# Patient Record
Sex: Male | Born: 1945 | Race: White | Hispanic: No | Marital: Married | Smoking: Current every day smoker
Health system: Southern US, Community
[De-identification: ages and names within clinical notes are randomized; demographics above are authoritative.]

## PROBLEM LIST (undated history)

## (undated) DIAGNOSIS — I10 Essential (primary) hypertension: Secondary | ICD-10-CM

## (undated) HISTORY — PX: GASTRECTOMY: SHX58

## (undated) HISTORY — DX: Essential (primary) hypertension: I10

---

## 2000-04-14 LAB — HM COLONOSCOPY

## 2003-12-20 ENCOUNTER — Ambulatory Visit (HOSPITAL_COMMUNITY): Admission: RE | Admit: 2003-12-20 | Discharge: 2003-12-20 | Payer: Self-pay

## 2004-03-21 ENCOUNTER — Ambulatory Visit: Payer: Self-pay | Admitting: Internal Medicine

## 2004-03-22 ENCOUNTER — Encounter: Admission: RE | Admit: 2004-03-22 | Discharge: 2004-03-22 | Payer: Self-pay | Admitting: Internal Medicine

## 2004-04-16 ENCOUNTER — Ambulatory Visit: Payer: Self-pay | Admitting: Internal Medicine

## 2004-05-07 ENCOUNTER — Ambulatory Visit: Payer: Self-pay | Admitting: Internal Medicine

## 2005-05-21 ENCOUNTER — Ambulatory Visit: Payer: Self-pay | Admitting: Internal Medicine

## 2005-05-28 ENCOUNTER — Ambulatory Visit: Payer: Self-pay | Admitting: Internal Medicine

## 2005-05-28 LAB — CONVERTED CEMR LAB: PSA: 1.33 ng/mL

## 2005-06-25 ENCOUNTER — Ambulatory Visit: Payer: Self-pay | Admitting: Internal Medicine

## 2005-08-28 ENCOUNTER — Ambulatory Visit: Payer: Self-pay | Admitting: Cardiology

## 2005-08-28 ENCOUNTER — Ambulatory Visit: Payer: Self-pay | Admitting: Internal Medicine

## 2005-08-28 ENCOUNTER — Encounter (INDEPENDENT_AMBULATORY_CARE_PROVIDER_SITE_OTHER): Payer: Self-pay | Admitting: Specialist

## 2005-08-28 ENCOUNTER — Observation Stay (HOSPITAL_COMMUNITY): Admission: EM | Admit: 2005-08-28 | Discharge: 2005-08-29 | Payer: Self-pay | Admitting: Emergency Medicine

## 2005-09-03 ENCOUNTER — Ambulatory Visit: Payer: Self-pay | Admitting: Internal Medicine

## 2005-09-26 ENCOUNTER — Ambulatory Visit: Payer: Self-pay | Admitting: Internal Medicine

## 2006-01-06 ENCOUNTER — Ambulatory Visit: Payer: Self-pay | Admitting: Internal Medicine

## 2007-04-19 ENCOUNTER — Ambulatory Visit: Payer: Self-pay | Admitting: Internal Medicine

## 2007-04-19 DIAGNOSIS — F17200 Nicotine dependence, unspecified, uncomplicated: Secondary | ICD-10-CM | POA: Insufficient documentation

## 2007-04-19 DIAGNOSIS — A630 Anogenital (venereal) warts: Secondary | ICD-10-CM | POA: Insufficient documentation

## 2007-04-19 DIAGNOSIS — I10 Essential (primary) hypertension: Secondary | ICD-10-CM | POA: Insufficient documentation

## 2007-12-18 ENCOUNTER — Emergency Department (HOSPITAL_COMMUNITY): Admission: EM | Admit: 2007-12-18 | Discharge: 2007-12-18 | Payer: Self-pay | Admitting: Emergency Medicine

## 2008-06-15 ENCOUNTER — Telehealth: Payer: Self-pay | Admitting: *Deleted

## 2008-07-10 ENCOUNTER — Telehealth: Payer: Self-pay | Admitting: Internal Medicine

## 2008-08-11 ENCOUNTER — Ambulatory Visit: Payer: Self-pay | Admitting: Internal Medicine

## 2008-08-16 LAB — CONVERTED CEMR LAB
ALT: 26 units/L (ref 0–53)
AST: 25 units/L (ref 0–37)
Albumin: 4 g/dL (ref 3.5–5.2)
Alkaline Phosphatase: 57 units/L (ref 39–117)
BUN: 28 mg/dL — ABNORMAL HIGH (ref 6–23)
Basophils Absolute: 0 10*3/uL (ref 0.0–0.1)
Basophils Relative: 0.1 % (ref 0.0–3.0)
Bilirubin, Direct: 0.2 mg/dL (ref 0.0–0.3)
CO2: 31 meq/L (ref 19–32)
Calcium: 9.4 mg/dL (ref 8.4–10.5)
Chloride: 104 meq/L (ref 96–112)
Cholesterol: 178 mg/dL (ref 0–200)
Creatinine, Ser: 1.6 mg/dL — ABNORMAL HIGH (ref 0.4–1.5)
Eosinophils Absolute: 0.2 10*3/uL (ref 0.0–0.7)
Eosinophils Relative: 2.2 % (ref 0.0–5.0)
GFR calc non Af Amer: 46.3 mL/min (ref >60)
Glucose, Bld: 79 mg/dL (ref 70–99)
HCT: 45.1 % (ref 39.0–52.0)
HDL: 38.1 mg/dL — ABNORMAL LOW (ref >39.00)
Hemoglobin: 16 g/dL (ref 13.0–17.0)
LDL Cholesterol: 127 mg/dL — ABNORMAL HIGH (ref 0–99)
Lymphocytes Relative: 32.3 % (ref 12.0–46.0)
Lymphs Abs: 2.9 10*3/uL (ref 0.7–4.0)
MCHC: 35.5 g/dL (ref 30.0–36.0)
MCV: 92.4 fL (ref 78.0–100.0)
Monocytes Absolute: 0.8 10*3/uL (ref 0.1–1.0)
Monocytes Relative: 9.4 % (ref 3.0–12.0)
Neutro Abs: 5 10*3/uL (ref 1.4–7.7)
Neutrophils Relative %: 56 % (ref 43.0–77.0)
PSA: 2.13 ng/mL (ref 0.10–4.00)
Platelets: 174 10*3/uL (ref 150.0–400.0)
Potassium: 4.6 meq/L (ref 3.5–5.1)
RBC: 4.88 M/uL (ref 4.22–5.81)
RDW: 13.6 % (ref 11.5–14.6)
Sodium: 140 meq/L (ref 135–145)
TSH: 1.25 microintl units/mL (ref 0.35–5.50)
Total Bilirubin: 0.9 mg/dL (ref 0.3–1.2)
Total CHOL/HDL Ratio: 5
Total Protein: 7.2 g/dL (ref 6.0–8.3)
Triglycerides: 63 mg/dL (ref 0.0–149.0)
VLDL: 12.6 mg/dL (ref 0.0–40.0)
WBC: 8.9 10*3/uL (ref 4.5–10.5)

## 2008-08-23 ENCOUNTER — Ambulatory Visit: Payer: Self-pay | Admitting: Gastroenterology

## 2008-08-23 ENCOUNTER — Telehealth (INDEPENDENT_AMBULATORY_CARE_PROVIDER_SITE_OTHER): Payer: Self-pay | Admitting: *Deleted

## 2008-09-05 ENCOUNTER — Telehealth: Payer: Self-pay | Admitting: Gastroenterology

## 2008-09-06 ENCOUNTER — Ambulatory Visit: Payer: Self-pay | Admitting: Gastroenterology

## 2008-09-07 ENCOUNTER — Encounter: Payer: Self-pay | Admitting: Gastroenterology

## 2009-08-21 ENCOUNTER — Telehealth: Payer: Self-pay | Admitting: Internal Medicine

## 2009-08-21 ENCOUNTER — Ambulatory Visit: Payer: Self-pay | Admitting: Internal Medicine

## 2010-04-22 ENCOUNTER — Telehealth: Payer: Self-pay | Admitting: Internal Medicine

## 2010-05-07 ENCOUNTER — Ambulatory Visit
Admission: RE | Admit: 2010-05-07 | Discharge: 2010-05-07 | Payer: Self-pay | Source: Home / Self Care | Attending: Internal Medicine | Admitting: Internal Medicine

## 2010-05-15 NOTE — Assessment & Plan Note (Signed)
Summary: DIZZY & ELEV BP/PS   Vital Signs:  Patient profile:   65 year old male Weight:      135 pounds BMI:     20.30 Temp:     97.8 degrees F oral Pulse rate:   76 / minute Pulse rhythm:   regular Resp:     14 per minute BP sitting:   122 / 74  (left arm) Cuff size:   regular  Vitals Entered By: Gladis Riffle, RN (Aug 21, 2009 10:10 AM) CC: c/o elevated BP and dizziness--BP 145/111 this AM at home--feels like dizziness is from equilibrium imbalance Is Patient Diabetic? No   CC:  c/o elevated BP and dizziness--BP 145/111 this AM at home--feels like dizziness is from equilibrium imbalance.  History of Present Illness: not seen in greater than one year complains of a.m. dizziness he describes room spinning.  sxs come and go and are related to changing position quickly or changing head position quickly  no headache , no neurologic deficiet  Preventive Screening-Counseling & Management  Alcohol-Tobacco     Smoking Status: current     Smoking Cessation Counseling: yes     Packs/Day: 1.0     Year Quit: 2007  Current Medications (verified): 1)  Enalapril-Hydrochlorothiazide 10-25 Mg  Tabs (Enalapril-Hydrochlorothiazide) .... Take 1 Tablet By Mouth Once A Day  Allergies: 1)  ! Codeine Phosphate (Codeine Phosphate)  Physical Exam  General:  Well-developed,well-nourished,in no acute distress; alert,appropriate and cooperative throughout examination Head:  normocephalic and atraumatic.   Eyes:  pupils equal and pupils round.  no significant nystagnmus Ears:  R ear normal and L ear normal.     Impression & Recommendations:  Problem # 1:  VERTIGO (ICD-780.4)  discussed trial meclizine side effects discussed  His updated medication list for this problem includes:    Meclizine Hcl 25 Mg Tabs (Meclizine hcl) .Marland Kitchen... 1 by mouth two times a day as needed vertigo  Complete Medication List: 1)  Enalapril-hydrochlorothiazide 10-25 Mg Tabs (Enalapril-hydrochlorothiazide) ....  Take 1 tablet by mouth once a day 2)  Meclizine Hcl 25 Mg Tabs (Meclizine hcl) .Marland Kitchen.. 1 by mouth two times a day as needed vertigo  Patient Instructions: 1)  . Prescriptions: MECLIZINE HCL 25 MG TABS (MECLIZINE HCL) 1 by mouth two times a day as needed vertigo  #20 x 1   Entered and Authorized by:   Birdie Sons MD   Signed by:   Birdie Sons MD on 08/21/2009   Method used:   [facility]        [facility] [address] [phone]* (retail)       [address]       [place], [place]  [address]       Ph: [phone] or [phone]       Fax: [phone]   RxID:   [id number]

## 2010-05-15 NOTE — Progress Notes (Signed)
Summary: dizzy & elevated BP  Phone Note Call from Patient Call back at [facility] Phone [phone]   Summary of Call: Requests ov.  Dizzy ams 3 days.  Takes couple hours.  BP 145/111.  On Enalopril 10-25 one daily.  [facility]  Allergic to steroids.   Workin per Dr. Armanda Magic.  Rudy Jew, RN  Aug 21, 2009 9:37 AM  Initial call taken by: Rudy Jew, RN,  Aug 21, 2009 9:28 AM

## 2010-05-16 NOTE — Progress Notes (Signed)
Summary: Pt sch ov for 05/07/10.Req partial refill of Enalepril to last   Phone Note Call from Patient Call back at [facility] Phone [phone] Call back at [phone] cell   Caller: Patient Summary of Call: Pt called and has sch an ov for 05/07/10 at 12pm. Pt req refill of Enalepril to last until appte date. [facility] [address]  Initial call taken by: Lucy Antigua,  April 22, 2010 9:54 AM  Follow-up for Phone Call        Phone Call Completed, Rx Called In Follow-up by: Alfred Levins, CMA,  April 22, 2010 10:23 AM    Prescriptions: ENALAPRIL-HYDROCHLOROTHIAZIDE 10-25 MG  TABS (ENALAPRIL-HYDROCHLOROTHIAZIDE) Take 1 tablet by mouth once a day*OFFICE VISIT DUE NOW*  #30 x 0   Entered by:   Alfred Levins, CMA   Authorized by:   Birdie Sons MD   Signed by:   Alfred Levins, CMA on 04/22/2010   Method used:   [facility]ically to        [facility] [address] [phone]* (retail)       [address]       [place], [place]  [address]       Ph: [phone] or [phone]       Fax: [phone]   RxID:   [id number]
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
## 2010-06-15 ENCOUNTER — Encounter: Payer: Self-pay | Admitting: Internal Medicine

## 2010-06-19 ENCOUNTER — Encounter: Payer: Self-pay | Admitting: Internal Medicine

## 2010-06-27 ENCOUNTER — Encounter: Payer: Self-pay | Admitting: Internal Medicine

## 2010-07-01 ENCOUNTER — Other Ambulatory Visit: Payer: Self-pay | Admitting: Internal Medicine

## 2010-07-01 ENCOUNTER — Encounter: Payer: Self-pay | Admitting: Internal Medicine

## 2010-07-01 ENCOUNTER — Ambulatory Visit (INDEPENDENT_AMBULATORY_CARE_PROVIDER_SITE_OTHER): Payer: PRIVATE HEALTH INSURANCE | Admitting: Internal Medicine

## 2010-07-01 DIAGNOSIS — I10 Essential (primary) hypertension: Secondary | ICD-10-CM

## 2010-07-01 DIAGNOSIS — R634 Abnormal weight loss: Secondary | ICD-10-CM

## 2010-07-01 DIAGNOSIS — R5383 Other fatigue: Secondary | ICD-10-CM

## 2010-07-01 DIAGNOSIS — R5381 Other malaise: Secondary | ICD-10-CM

## 2010-07-01 DIAGNOSIS — F17200 Nicotine dependence, unspecified, uncomplicated: Secondary | ICD-10-CM

## 2010-07-01 LAB — HEPATIC FUNCTION PANEL
ALT: 14 U/L (ref 0–53)
AST: 22 U/L (ref 0–37)
Albumin: 4.1 g/dL (ref 3.5–5.2)
Alkaline Phosphatase: 55 U/L (ref 39–117)
Total Bilirubin: 0.6 mg/dL (ref 0.3–1.2)
Total Protein: 6.9 g/dL (ref 6.0–8.3)

## 2010-07-01 LAB — CBC WITH DIFFERENTIAL/PLATELET
Basophils Absolute: 0.1 10*3/uL (ref 0.0–0.1)
Eosinophils Absolute: 0.2 10*3/uL (ref 0.0–0.7)
Eosinophils Relative: 2.7 % (ref 0.0–5.0)
HCT: 48.7 % (ref 39.0–52.0)
Hemoglobin: 16.7 g/dL (ref 13.0–17.0)
Lymphocytes Relative: 26.6 % (ref 12.0–46.0)
Lymphs Abs: 2.3 10*3/uL (ref 0.7–4.0)
MCHC: 34.3 g/dL (ref 30.0–36.0)
MCV: 95.1 fl (ref 78.0–100.0)
Monocytes Absolute: 0.7 10*3/uL (ref 0.1–1.0)
Monocytes Relative: 8 % (ref 3.0–12.0)
Neutro Abs: 5.4 10*3/uL (ref 1.4–7.7)
Neutrophils Relative %: 62.1 % (ref 43.0–77.0)
Platelets: 203 10*3/uL (ref 150.0–400.0)
RBC: 5.12 Mil/uL (ref 4.22–5.81)
RDW: 13.9 % (ref 11.5–14.6)
WBC: 8.7 10*3/uL (ref 4.5–10.5)

## 2010-07-01 LAB — TESTOSTERONE: Testosterone: 623.71 ng/dL (ref 350.00–890.00)

## 2010-07-01 LAB — SEDIMENTATION RATE: Sed Rate: 4 mm/hr (ref 0–22)

## 2010-07-01 LAB — TSH: TSH: 1.21 u[IU]/mL (ref 0.35–5.50)

## 2010-07-01 LAB — PSA: PSA: 1.48 ng/mL (ref 0.10–4.00)
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
## 2010-07-01 NOTE — Assessment & Plan Note (Signed)
New complaints Check labs today

## 2010-07-01 NOTE — Assessment & Plan Note (Signed)
Controlled Continue curent meds

## 2010-07-01 NOTE — Assessment & Plan Note (Signed)
Unclear etiology Has had evaluation previously i wonder about Sprue---check labs

## 2010-07-01 NOTE — Assessment & Plan Note (Signed)
Long term smoker Advised absolute abstinence

## 2010-07-02 LAB — GLIA (IGA/G) + TTG IGA
Gliadin IgA: 3.3 U/mL (ref <20)
Gliadin IgG: 4.1 U/mL (ref <20)
Tissue Transglutaminase Ab, IgA: 5.2 U/mL (ref <20)

## 2010-08-01 ENCOUNTER — Ambulatory Visit (INDEPENDENT_AMBULATORY_CARE_PROVIDER_SITE_OTHER): Payer: PRIVATE HEALTH INSURANCE | Admitting: Internal Medicine

## 2010-08-01 ENCOUNTER — Other Ambulatory Visit: Payer: Self-pay | Admitting: Internal Medicine

## 2010-08-01 ENCOUNTER — Encounter: Payer: Self-pay | Admitting: Internal Medicine

## 2010-08-01 DIAGNOSIS — N529 Male erectile dysfunction, unspecified: Secondary | ICD-10-CM

## 2010-08-01 DIAGNOSIS — R109 Unspecified abdominal pain: Secondary | ICD-10-CM | POA: Insufficient documentation

## 2010-08-01 DIAGNOSIS — C44519 Basal cell carcinoma of skin of other part of trunk: Secondary | ICD-10-CM

## 2010-08-01 DIAGNOSIS — D049 Carcinoma in situ of skin, unspecified: Secondary | ICD-10-CM

## 2010-08-01 MED ORDER — TADALAFIL 5 MG PO TABS: 5.0000 mg | ORAL_TABLET | Freq: Every day | ORAL | Status: AC | PRN

## 2010-08-01 NOTE — Assessment & Plan Note (Signed)
Patient describes abdominal discomfort. Has significant gas. He had some evaluation in the past. I think he needs endoscopy. I will refer to GI.

## 2010-08-01 NOTE — Assessment & Plan Note (Signed)
Reviewed previous labs. We'll try Cialis. Prescription given. Side effects discussed.
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
## 2010-08-14 ENCOUNTER — Inpatient Hospital Stay (HOSPITAL_COMMUNITY)
Admission: EM | Admit: 2010-08-14 | Discharge: 2010-08-16 | DRG: 602 | Disposition: A | Discharge status: Home / Self Care | Payer: No Typology Code available for payment source | Attending: Internal Medicine | Admitting: Internal Medicine

## 2010-08-14 ENCOUNTER — Inpatient Hospital Stay (INDEPENDENT_AMBULATORY_CARE_PROVIDER_SITE_OTHER)
Admission: RE | Admit: 2010-08-14 | Discharge: 2010-08-14 | Disposition: A | Discharge status: Home / Self Care | Payer: PRIVATE HEALTH INSURANCE | Source: Ambulatory Visit | Attending: Family Medicine | Admitting: Family Medicine

## 2010-08-14 ENCOUNTER — Emergency Department (HOSPITAL_COMMUNITY): Payer: No Typology Code available for payment source

## 2010-08-14 DIAGNOSIS — L02619 Cutaneous abscess of unspecified foot: Secondary | ICD-10-CM

## 2010-08-14 DIAGNOSIS — I129 Hypertensive chronic kidney disease with stage 1 through stage 4 chronic kidney disease, or unspecified chronic kidney disease: Secondary | ICD-10-CM | POA: Diagnosis present

## 2010-08-14 DIAGNOSIS — D72829 Elevated white blood cell count, unspecified: Secondary | ICD-10-CM | POA: Diagnosis present

## 2010-08-14 DIAGNOSIS — J189 Pneumonia, unspecified organism: Secondary | ICD-10-CM | POA: Diagnosis present

## 2010-08-14 DIAGNOSIS — J4489 Other specified chronic obstructive pulmonary disease: Secondary | ICD-10-CM | POA: Diagnosis present

## 2010-08-14 DIAGNOSIS — N189 Chronic kidney disease, unspecified: Secondary | ICD-10-CM | POA: Diagnosis present

## 2010-08-14 DIAGNOSIS — J449 Chronic obstructive pulmonary disease, unspecified: Secondary | ICD-10-CM | POA: Diagnosis present

## 2010-08-14 DIAGNOSIS — L03039 Cellulitis of unspecified toe: Principal | ICD-10-CM | POA: Diagnosis present

## 2010-08-14 DIAGNOSIS — F17200 Nicotine dependence, unspecified, uncomplicated: Secondary | ICD-10-CM | POA: Diagnosis present

## 2010-08-14 LAB — URINALYSIS, ROUTINE W REFLEX MICROSCOPIC
Glucose, UA: NEGATIVE mg/dL
Ketones, ur: NEGATIVE mg/dL
Leukocytes, UA: NEGATIVE
pH: 5.5 (ref 5.0–8.0)

## 2010-08-14 LAB — DIFFERENTIAL
Basophils Absolute: 0 10*3/uL (ref 0.0–0.1)
Basophils Relative: 0 % (ref 0–1)
Eosinophils Absolute: 0.1 10*3/uL (ref 0.0–0.7)
Eosinophils Relative: 1 % (ref 0–5)
Lymphocytes Relative: 9 % — ABNORMAL LOW (ref 12–46)
Monocytes Absolute: 1.2 10*3/uL — ABNORMAL HIGH (ref 0.1–1.0)
Monocytes Relative: 6 % (ref 3–12)
Neutro Abs: 17.6 10*3/uL — ABNORMAL HIGH (ref 1.7–7.7)

## 2010-08-14 LAB — CBC
HCT: 43.8 % (ref 39.0–52.0)
MCH: 32.6 pg (ref 26.0–34.0)
MCHC: 35.4 g/dL (ref 30.0–36.0)
RBC: 4.76 MIL/uL (ref 4.22–5.81)
RDW: 13.6 % (ref 11.5–15.5)
WBC: 20.9 10*3/uL — ABNORMAL HIGH (ref 4.0–10.5)

## 2010-08-14 LAB — URINE MICROSCOPIC-ADD ON

## 2010-08-14 LAB — COMPREHENSIVE METABOLIC PANEL
ALT: 15 U/L (ref 0–53)
Calcium: 9.5 mg/dL (ref 8.4–10.5)
Glucose, Bld: 94 mg/dL (ref 70–99)
Sodium: 136 mEq/L (ref 135–145)
Total Protein: 7.1 g/dL (ref 6.0–8.3)

## 2010-08-15 ENCOUNTER — Inpatient Hospital Stay (HOSPITAL_COMMUNITY): Payer: No Typology Code available for payment source

## 2010-08-15 LAB — CBC
MCH: 31.6 pg (ref 26.0–34.0)
MCHC: 34 g/dL (ref 30.0–36.0)
Platelets: 149 10*3/uL — ABNORMAL LOW (ref 150–400)
RDW: 13.9 % (ref 11.5–15.5)

## 2010-08-15 LAB — COMPREHENSIVE METABOLIC PANEL
AST: 19 U/L (ref 0–37)
Albumin: 2.8 g/dL — ABNORMAL LOW (ref 3.5–5.2)
Calcium: 8.6 mg/dL (ref 8.4–10.5)
Creatinine, Ser: 1.65 mg/dL — ABNORMAL HIGH (ref 0.4–1.5)
GFR calc Af Amer: 51 mL/min — ABNORMAL LOW (ref >60)
GFR calc non Af Amer: 42 mL/min — ABNORMAL LOW (ref >60)

## 2010-08-15 LAB — URIC ACID: Uric Acid, Serum: 7.5 mg/dL (ref 4.0–7.8)

## 2010-08-16 LAB — DIFFERENTIAL
Eosinophils Relative: 2 % (ref 0–5)
Lymphocytes Relative: 19 % (ref 12–46)
Lymphs Abs: 2.5 10*3/uL (ref 0.7–4.0)
Monocytes Absolute: 1.2 10*3/uL — ABNORMAL HIGH (ref 0.1–1.0)

## 2010-08-16 LAB — CBC
HCT: 36.4 % — ABNORMAL LOW (ref 39.0–52.0)
MCV: 91.9 fL (ref 78.0–100.0)
RDW: 13.7 % (ref 11.5–15.5)
WBC: 13.4 10*3/uL — ABNORMAL HIGH (ref 4.0–10.5)

## 2010-08-16 LAB — COMPREHENSIVE METABOLIC PANEL
Alkaline Phosphatase: 53 U/L (ref 39–117)
BUN: 24 mg/dL — ABNORMAL HIGH (ref 6–23)
CO2: 27 mEq/L (ref 19–32)
GFR calc non Af Amer: 40 mL/min — ABNORMAL LOW (ref >60)
Glucose, Bld: 95 mg/dL (ref 70–99)
Potassium: 3.9 mEq/L (ref 3.5–5.1)
Total Bilirubin: 0.4 mg/dL (ref 0.3–1.2)
Total Protein: 5.4 g/dL — ABNORMAL LOW (ref 6.0–8.3)

## 2010-08-16 LAB — MAGNESIUM: Magnesium: 2.1 mg/dL (ref 1.5–2.5)

## 2010-08-19 ENCOUNTER — Telehealth: Payer: Self-pay | Admitting: *Deleted

## 2010-08-19 NOTE — Telephone Encounter (Signed)
Wife called asking Dr. Cato Mulligan to review pt's recent hospitalization so as to give an answer if he should attend his father's funeral. Pt was admitted for cellulitis of the foot tested positive for staph, pneumonia and ? Renal insufficiency.  His wbc was 20, 900 at admission.  He is being treated for MRSA, but this was not proven by testing.  He is home on complete bed rest .   They need to know ASAP what Dr. Cato Mulligan thinks.
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
## 2010-08-19 NOTE — Telephone Encounter (Signed)
Per Dr. Cato Mulligan, the family can make this decision.  He feels there is no problem with him being contagious to anyone.  If pt feels he can tolerate the trip, it is ok.

## 2010-08-21 LAB — CULTURE, BLOOD (ROUTINE X 2)
Culture  Setup Time: 201205030614
Culture: NO GROWTH
Culture: NO GROWTH

## 2010-08-22 ENCOUNTER — Encounter: Payer: Self-pay | Admitting: Internal Medicine

## 2010-08-22 ENCOUNTER — Ambulatory Visit (INDEPENDENT_AMBULATORY_CARE_PROVIDER_SITE_OTHER): Payer: PRIVATE HEALTH INSURANCE | Admitting: Internal Medicine

## 2010-08-22 DIAGNOSIS — L0291 Cutaneous abscess, unspecified: Secondary | ICD-10-CM

## 2010-08-22 DIAGNOSIS — L0390 Cellulitis, unspecified: Secondary | ICD-10-CM

## 2010-08-22 DIAGNOSIS — N289 Disorder of kidney and ureter, unspecified: Secondary | ICD-10-CM

## 2010-08-22 DIAGNOSIS — R21 Rash and other nonspecific skin eruption: Secondary | ICD-10-CM

## 2010-08-22 DIAGNOSIS — F17200 Nicotine dependence, unspecified, uncomplicated: Secondary | ICD-10-CM

## 2010-08-22 MED ORDER — SULFAMETHOXAZOLE-TMP DS 800-160 MG PO TABS: 1.0000 | ORAL_TABLET | Freq: Two times a day (BID) | ORAL | Status: DC

## 2010-08-22 MED ORDER — AMOXICILLIN-POT CLAVULANATE 875-125 MG PO TABS: 1.0000 | ORAL_TABLET | Freq: Two times a day (BID) | ORAL | Status: DC

## 2010-08-25 ENCOUNTER — Encounter: Payer: Self-pay | Admitting: Internal Medicine

## 2010-08-25 DIAGNOSIS — L0390 Cellulitis, unspecified: Secondary | ICD-10-CM | POA: Insufficient documentation

## 2010-08-25 DIAGNOSIS — R21 Rash and other nonspecific skin eruption: Secondary | ICD-10-CM | POA: Insufficient documentation

## 2010-08-25 DIAGNOSIS — N289 Disorder of kidney and ureter, unspecified: Secondary | ICD-10-CM | POA: Insufficient documentation
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
## 2010-08-25 NOTE — Assessment & Plan Note (Signed)
Counseled regarding the need for cessation.

## 2010-08-25 NOTE — Progress Notes (Signed)
  Subjective:    Patient ID: Nathan Gentry, male    DOB: June 05, 1945, 65 y.o.   MRN: [id number]  HPI Patient presents to clinic for evaluation of foot cellulitis. Recently hospitalized with a right foot cellulitis treated initially with vancomycin, Levaquin and clindamycin IV. Clindamycin was stopped after initial clinical improvement. Was discharged home on 7 days of Bactrim and Levaquin. MRSA nasal screen positive and placed on bacitracin to nares. Initial leukocytosis of 20,000 in improved to 13,000 prior to discharge. Blood cultures negative x2. Noted to have renal insufficiency with a creatinine approximately 1.6-1.7. Right foot x-ray normal. Apparently there was initial concern over possible pneumonia related to abnormal chest x-ray. Chest CT demonstrated no infiltrate but COPD changes. May have renal cysts and atrophy of left kidney. Incidental notation of mild a standing aortic aneurysm without change compared to study of 2005.  Presents today stating continued mild improvement of right foot swelling and erythema. No recent fever or chills. No wound or drainage. Improving pain and able to weight-bear. Notes itchy rash of back since hospitalization. No alleviating or exacerbating factors. Has not spread. No other complaints.  Reviewed past medical history, medications and allergies    Review of Systems  Constitutional: Negative for fever and chills.  HENT: Negative for facial swelling.   Respiratory: Negative for shortness of breath.   Cardiovascular: Negative for leg swelling.  Skin: Positive for color change and rash. Negative for pallor and wound.       Objective:   Physical Exam  Nursing note and vitals reviewed. Constitutional: He appears well-developed and well-nourished. No distress.  HENT:  Head: Normocephalic and atraumatic.  Right Ear: External ear normal.  Left Ear: External ear normal.  Nose: Nose normal.  Eyes: Conjunctivae are normal. Right eye exhibits no discharge.  Left eye exhibits no discharge. No scleral icterus.  Cardiovascular: Normal rate, regular rhythm and normal heart sounds.  Exam reveals no gallop and no friction rub.   No murmur heard. Pulmonary/Chest: Effort normal and breath sounds normal. No respiratory distress. He has no wheezes. He has no rales.  Neurological: He is alert.  Skin: Skin is warm and dry. Rash noted. He is not diaphoretic. There is erythema. No pallor.       Examination right foot: Mild diffuse edema with erythema dorsal aspect of foot. No wound or ulceration or drainage. There is evidence of diffuse tinea pedis  Psychiatric: He has a normal mood and affect.          Assessment & Plan:

## 2010-08-25 NOTE — Assessment & Plan Note (Signed)
Mild and stable. Asymptomatic. Avoid anti-inflammatories. Has follow up with PMD in the near future. Consider repeat CBC and Chem-7. Incidental notation of possible renal cysts and left renal atrophy on CT scan

## 2010-08-25 NOTE — Assessment & Plan Note (Signed)
Persistent but improved. Extended antibiotic course with an additional 14 days of Septra. Change Levaquin to Augmentin x14 days due to possible rash from Levaquin.Treat underlying tinea pedis with Lotrimin ultra twice a day. Followup if no improvement or worsening.

## 2010-08-25 NOTE — Assessment & Plan Note (Signed)
Suspected to be medication related. Consider Levaquin as possible culprit. Change Levaquin to Augmentin. Followup if no improvement or worsening

## 2010-08-30 NOTE — Discharge Summary (Signed)
Nathan Gentry, PINKHAM NO.:  [id number]   MEDICAL RECORD NO.:  [id number]          PATIENT TYPE:  INP   LOCATION:  5524                         FACILITY:  [facility]   PHYSICIAN:  Stacie Glaze, M.D. LHCDATE OF BIRTH:  1945-06-17   DATE OF ADMISSION:  08/27/2005  DATE OF DISCHARGE:  08/29/2005                                 DISCHARGE SUMMARY   ADMISSION DIAGNOSIS:  Near syncope.   DISCHARGE DIAGNOSES:  1.  Near syncope.  2.  Hypertension.  3.  Renal insufficiency.  4.  Depression.   HOSPITAL COURSE:  The patient is a 65 year old, white male admitted with an  episode of near syncope in which he had lightheadedness and dizziness off  and on for several months graded at a 3/10.  He presented in the emergency  room with a severe lightheadedness which he rated at a 10/10, but he denied  any palpitations, headache, visual changes or focal weakness or any signs  consistent with stroke.   He states that his blood pressure medications have been stable for the past  2 weeks, but he had recently been diagnosed with hypertension after  presenting with a headache a few months earlier.  They were adjusted  approximately 1 month ago and his symptoms did not change and his  medications were adjusted at that time.  It was felt that his near syncope  was multiple etiologies.  He had a CT of the head and cardiac enzymes were  obtained to rule out cardiovascular disease  A Cardiolite study was  performed and read as negative.  Carotid duplexes were obtained which showed  no significant stenosis.   From the standpoint of his presyncope, it was felt that sufficient workup  was obtained to rule out cardiovascular disease and he was felt stable for  discharge for further followup with his primary care physician, Dr. Cato Mulligan.   PROCEDURES:  1.  Cardiolite.  2.  Carotid Dopplers.   DISCHARGE MEDICATIONS:  1.  Aspirin 325 mg one a day.  2.  Protonix 40 mg one a day.  3.  Cymbalta  60 mg one a day.   SPECIAL INSTRUCTIONS:  During his hospitalization, his blood pressure has  been adequately controlled without medications and because of that, he will  remain off medications until he is seen by Dr. Cato Mulligan, his primary care  physician.           ______________________________  Stacie Glaze, M.D. [facility]     JEJ/MEDQ  D:  08/29/2005  T:  08/30/2005  Job:  [id number]   cc:   Valetta Mole. Swords, M.D. [facility]  [address] [place]  [place] [address]

## 2010-08-30 NOTE — H&P (Signed)
NAMECLAUDIUS, Nathan Gentry NO.:  [id number]   MEDICAL RECORD NO.:  [id number]          PATIENT TYPE:  INP   LOCATION:  5524                         FACILITY:  [facility]   PHYSICIAN:  Lorain Childes, M.D. LHCDATE OF BIRTH:  [birth year]   DATE OF ADMISSION:  08/27/2005  DATE OF DISCHARGE:                                HISTORY & PHYSICAL   PRIMARY CARE PHYSICIAN:  Valetta Mole. Swords, M.D.   CHIEF COMPLAINT:  Near-syncope.   HISTORY OF PRESENT ILLNESS:  The patient is a 65 year old gentleman with a  history of hypertension, who presented to the ER with near-syncope.  The  patient reports he has lightheadedness and dizziness for the past couple of  months, typically mild, rated a 1-3/10.  It is noted mostly with changing  position and is easily relieved after he adjusts to his position and lasts  approximately one minute.  Today his symptoms began around 6:30 p.m. and he  reported severe lightheadedness, which was rated at a 10/10.  This lasted  approximately 10 minutes.  It began while he was seated and worsened when he  attempted to stand.  He felt lightheaded and was staggering across his yard  to his house.  Once arriving in his house, his wife called EMS and his  symptoms resolved after sitting down again.  He denied any chest pain or  chest tightness.  He did have some mild nausea with dry heaves.  He also  reports diaphoresis.  He denies any palpitations, no headache or visual  changes, no focal weakness.  Of note, he does report that his symptoms of  lightheadedness began a couple of months ago when he was started on a blood  pressure medication once he was diagnosed with hypertension after presenting  with a headache for a few months.  He reports his blood pressure medications  have been stable for the past two weeks.  They were adjusted approximately a  month ago and his symptoms did not change when his medications were  adjusted.   PAST MEDICAL HISTORY:  1.   Hypertension as stated above, diagnosed a couple of months ago.  He has      been on medications.  He recently presented with headache to his primary      care physician.  He had a head CT at that time, which was negative.  2.  Depression.  3.  A history of hives and joint aches.  He had a workup for this 2-1/2      years ago, which was unremarkable.   MEDICATIONS:  Hydrochlorothiazide, also lisinopril, doses unknown.  He is  also on Cymbalta.   ALLERGIES:  MORPHINE, which causes agitation, and also STEROIDS, including  PREDNISONE and CORTISOL, which cause hallucinations and severe paranoia.   SOCIAL HISTORY:  He lives in [place] with his wife.  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  He has a 45 pack-year history of  tobacco.  He quit two months ago.  He used to drink alcohol but quit in  1977.  He denies any drugs  or herbal medication.  He follows a regular diet.   FAMILY HISTORY:  His mother died at the age of 51 from an MI, and father is  alive at the age of 60.  He has five siblings, who are all okay.   REVIEW OF SYSTEMS:  He denies any fevers, chills, no cough or sputum, no  headache or visual changes, no skin rashes or lesions.  Denies any chest  pain, shortness of breath, dyspnea on exertion or orthopnea, no PND, no  lower extremity edema, no palpitations.  He reports near-syncope as  described above but denies frank syncopal event.  He denies any coughing or  wheezing.  He denies any urinary symptoms.  NEUROPSYCHIATRIC:  He denies any  weakness or numbness.  He has mild depression, which is controlled.  He  denies any new joint swelling or difficulty.  He had nausea as stated above  but no vomiting, no diarrhea, no bright red blood per rectum, no  hematemesis, no pyuria, no polydipsia, no heat or cold intolerance.  All  other systems are negative.   PHYSICAL EXAMINATION:  VITAL SIGNS:  Temperature is 98.2, pulse 66,  respirations 20, blood pressure  115/77.  Orthostatics were checked, and his  blood pressure was 130/73 sitting and 104/75 standing.  His pulse went from  75 to 87.  GENERAL:  He is a very pleasant man who is comfortable, in no acute  distress.  HEENT:  Normocephalic, atraumatic.  Oropharynx is clear.  NECK:  Supple.  He has no carotid bruits.  His JVP is approximately 6 cm.  He has 2+ carotid upstrokes.  CARDIOVASCULAR:  Normal S1, split S2, regular rate and rhythm.  He has no  murmurs appreciated.  His PMI is nondisplaced.  His pulses are 2+  throughout.  LUNGS:  Clear to auscultation bilaterally.  ABDOMEN:  Soft, positive bowel sounds, nontender, no organomegaly.  EXTREMITIES:  He has no edema.  He has 2+ distal pulses.  NEUROLOGIC:  He is alert and oriented x3.  Cranial nerves are grossly  intact.  Strength is 5/5 throughout.  Sensation is intact.   Chest x-ray shows COPD, no acute findings.  EKG shows rate 77, sinus rhythm,  with frequent PACs.  He has a left anterior fascicular block.  His PR  interval is less than 200 msec, QRS is 112 msec, QTC is 430 msec.  He has no  acute ischemic changes.   LABORATORY DATA:  White count of 9.5, hematocrit of 47, platelets of 207.  His potassium is 4.5, creatinine is 2.3 and glucose is 113.  Point of care  cardiac enzymes:  Troponin is less than 0.05, repeat is less than 0.05, his  MB is 1.6, repeat is 1.9, myoglobin is 88.9, repeat is 123.   ASSESSMENT AND PLAN:  The patient is a 65 year old gentleman with history of  hypertension and mild near-syncope for the past couple of months, who now  presents with a severe near-syncopal event.   1.  Near-syncope.  Etiology is likely multifactorial.  He has mild      orthostasis, so we will try to correct his volume status.  Will hold his      diuretic and monitor his blood pressure closely.  I will give him normal      saline for a liter this evening.  The severity of this episode is more     concerning, though, and its acuity  makes my suspicion for cardiac or  neurologic etiology higher.  To evaluate for cardiac, I will monitor him      on telemetry.  We will cycle his cardiac enzymes.  Will plan for a      stress test in the morning if his enzymes remain negative.  For      neurologic, his exam is benign.  He had a head CT a couple of months      ago.  We will check carotid ultrasounds if this has not been done      previously.  2.  Hypertension.  His blood pressure is stable currently.  I am holding his      medications as he is moderately volume-depleted.  I am holding his      diuretic, and his creatinine is elevated so I am holding his ACE      inhibitor.  Will follow blood pressure and adjust medications as needed.  3.  Renal insufficiency.  We need his office labs to compare this to prior      studies.  He denies a history of renal insufficiency.  Will check his      urine creatinine and protein.  Will check a UA.  Will also check for      eosinophils.  I am stopping his ACE inhibitor and will consider a      carotid ultrasound pending his repeat creatinine.  4.  Depression.  Will obtain the dose of his Cymbalta and then continue this      once this data is available.          ______________________________  Lorain Childes, M.D. [facility]    CGF/MEDQ  D:  08/28/2005  T:  08/28/2005  Job:  [id number]

## 2010-09-02 ENCOUNTER — Ambulatory Visit (INDEPENDENT_AMBULATORY_CARE_PROVIDER_SITE_OTHER)
Admission: RE | Admit: 2010-09-02 | Discharge: 2010-09-02 | Disposition: A | Discharge status: Home / Self Care | Payer: PRIVATE HEALTH INSURANCE | Source: Ambulatory Visit | Attending: Internal Medicine | Admitting: Internal Medicine

## 2010-09-02 ENCOUNTER — Encounter: Payer: Self-pay | Admitting: Internal Medicine

## 2010-09-02 ENCOUNTER — Ambulatory Visit (INDEPENDENT_AMBULATORY_CARE_PROVIDER_SITE_OTHER): Payer: PRIVATE HEALTH INSURANCE | Admitting: Internal Medicine

## 2010-09-02 DIAGNOSIS — N2889 Other specified disorders of kidney and ureter: Secondary | ICD-10-CM

## 2010-09-02 DIAGNOSIS — I714 Abdominal aortic aneurysm, without rupture, unspecified: Secondary | ICD-10-CM

## 2010-09-02 DIAGNOSIS — I2510 Atherosclerotic heart disease of native coronary artery without angina pectoris: Secondary | ICD-10-CM

## 2010-09-02 DIAGNOSIS — N289 Disorder of kidney and ureter, unspecified: Secondary | ICD-10-CM

## 2010-09-02 DIAGNOSIS — L0390 Cellulitis, unspecified: Secondary | ICD-10-CM

## 2010-09-02 LAB — CBC WITH DIFFERENTIAL/PLATELET
Basophils Relative: 0.5 % (ref 0.0–3.0)
Eosinophils Relative: 2.9 % (ref 0.0–5.0)
Lymphocytes Relative: 29.9 % (ref 12.0–46.0)
Monocytes Relative: 6.9 % (ref 3.0–12.0)
Neutrophils Relative %: 59.8 % (ref 43.0–77.0)
Platelets: 244 10*3/uL (ref 150.0–400.0)
RBC: 4.47 Mil/uL (ref 4.22–5.81)
WBC: 8.6 10*3/uL (ref 4.5–10.5)

## 2010-09-02 LAB — HIGH SENSITIVITY CRP: CRP, High Sensitivity: 0.46 mg/L (ref 0.00–5.00)

## 2010-09-02 LAB — SEDIMENTATION RATE: Sed Rate: 8 mm/hr (ref 0–22)

## 2010-09-02 NOTE — Progress Notes (Signed)
Addended by: Rossie Muskrat on: 09/02/2010 09:54 AM   Modules accepted: Orders
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
## 2010-09-02 NOTE — Assessment & Plan Note (Addendum)
Much improved but not cured i'm surprised with the residual pain and erythema Needs further evaluation See labs and studies---need to eval for osteomyelitis

## 2010-09-03 ENCOUNTER — Other Ambulatory Visit: Payer: Self-pay | Admitting: Internal Medicine

## 2010-09-03 DIAGNOSIS — L03115 Cellulitis of right lower limb: Secondary | ICD-10-CM

## 2010-09-04 ENCOUNTER — Other Ambulatory Visit: Payer: Self-pay | Admitting: Internal Medicine

## 2010-09-04 ENCOUNTER — Telehealth: Payer: Self-pay | Admitting: *Deleted

## 2010-09-04 NOTE — Telephone Encounter (Signed)
Wife wants to know if pt should have refills on Augmentin and Bactrim???

## 2010-09-05 ENCOUNTER — Ambulatory Visit
Admission: RE | Admit: 2010-09-05 | Discharge: 2010-09-05 | Disposition: A | Discharge status: Home / Self Care | Payer: PRIVATE HEALTH INSURANCE | Source: Ambulatory Visit | Attending: Internal Medicine | Admitting: Internal Medicine

## 2010-09-05 ENCOUNTER — Other Ambulatory Visit: Payer: Self-pay | Admitting: Internal Medicine

## 2010-09-05 DIAGNOSIS — L03115 Cellulitis of right lower limb: Secondary | ICD-10-CM

## 2010-09-05 NOTE — Telephone Encounter (Signed)
MRI today, and pt wants to know for sure about antibiotics after Dr. Cato Mulligan looks at MRI, please.

## 2010-09-05 NOTE — Telephone Encounter (Signed)
i do not think so He should be getting mri foot

## 2010-09-06 ENCOUNTER — Other Ambulatory Visit: Payer: Self-pay | Admitting: *Deleted

## 2010-09-06 MED ORDER — SULFAMETHOXAZOLE-TMP DS 800-160 MG PO TABS: 1.0000 | ORAL_TABLET | Freq: Two times a day (BID) | ORAL | Status: DC

## 2010-09-06 MED ORDER — AMOXICILLIN-POT CLAVULANATE 875-125 MG PO TABS: 1.0000 | ORAL_TABLET | Freq: Two times a day (BID) | ORAL | Status: DC

## 2010-09-10 ENCOUNTER — Other Ambulatory Visit: Payer: PRIVATE HEALTH INSURANCE

## 2010-09-10 NOTE — Telephone Encounter (Signed)
Given pt the MRI results and appt given due to foot remaining red/hot/swollen.

## 2010-09-11 ENCOUNTER — Ambulatory Visit (INDEPENDENT_AMBULATORY_CARE_PROVIDER_SITE_OTHER): Payer: PRIVATE HEALTH INSURANCE | Admitting: Internal Medicine

## 2010-09-11 ENCOUNTER — Encounter: Payer: Self-pay | Admitting: Internal Medicine

## 2010-09-11 VITALS — BP 128/84 | Temp 98.6°F | Wt 137.0 lb

## 2010-09-11 DIAGNOSIS — L02619 Cutaneous abscess of unspecified foot: Secondary | ICD-10-CM

## 2010-09-11 MED ORDER — DOXYCYCLINE HYCLATE 100 MG PO TABS: 100.0000 mg | ORAL_TABLET | Freq: Two times a day (BID) | ORAL | Status: AC
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
## 2010-09-13 ENCOUNTER — Ambulatory Visit: Payer: PRIVATE HEALTH INSURANCE | Admitting: Internal Medicine

## 2010-10-02 ENCOUNTER — Other Ambulatory Visit: Payer: Self-pay | Admitting: Internal Medicine

## 2010-10-02 NOTE — Telephone Encounter (Signed)
Pt is req to get amoxicillin called in asap today to [facility] for bacterial inf in foot. Pt is sch to come in for ov on fri 10-04-10/

## 2010-10-02 NOTE — Telephone Encounter (Signed)
augmentin 875 mg 1 po bid for 10 days

## 2010-10-03 MED ORDER — AMOXICILLIN-POT CLAVULANATE 875-125 MG PO TABS: 1.0000 | ORAL_TABLET | Freq: Two times a day (BID) | ORAL | Status: DC

## 2010-10-03 NOTE — Telephone Encounter (Signed)
rx sent in electronically, pt aware 

## 2010-10-04 ENCOUNTER — Ambulatory Visit (INDEPENDENT_AMBULATORY_CARE_PROVIDER_SITE_OTHER): Payer: No Typology Code available for payment source | Admitting: Internal Medicine

## 2010-10-04 ENCOUNTER — Encounter: Payer: Self-pay | Admitting: Internal Medicine

## 2010-10-04 VITALS — BP 144/98 | Temp 98.5°F | Wt 135.0 lb

## 2010-10-04 DIAGNOSIS — M79673 Pain in unspecified foot: Secondary | ICD-10-CM

## 2010-10-04 DIAGNOSIS — M79609 Pain in unspecified limb: Secondary | ICD-10-CM
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
## 2011-04-15 LAB — HM COLONOSCOPY

## 2011-05-26 ENCOUNTER — Other Ambulatory Visit: Payer: Self-pay | Admitting: *Deleted

## 2011-05-26 MED ORDER — ENALAPRIL-HYDROCHLOROTHIAZIDE 10-25 MG PO TABS: 1.0000 | ORAL_TABLET | Freq: Every day | ORAL | Status: DC

## 2012-04-06 ENCOUNTER — Ambulatory Visit: Payer: No Typology Code available for payment source | Admitting: Internal Medicine

## 2012-07-02 ENCOUNTER — Other Ambulatory Visit: Payer: Self-pay | Admitting: *Deleted

## 2012-07-02 NOTE — Telephone Encounter (Signed)
Opened in error

## 2012-07-05 ENCOUNTER — Other Ambulatory Visit: Payer: Self-pay | Admitting: *Deleted

## 2012-07-05 MED ORDER — ENALAPRIL-HYDROCHLOROTHIAZIDE 10-25 MG PO TABS: 1.0000 | ORAL_TABLET | Freq: Every day | ORAL | Status: DC

## 2012-09-01 ENCOUNTER — Telehealth: Payer: Self-pay | Admitting: Internal Medicine

## 2012-09-01 MED ORDER — ENALAPRIL-HYDROCHLOROTHIAZIDE 10-25 MG PO TABS: 1.0000 | ORAL_TABLET | Freq: Every day | ORAL | Status: DC

## 2012-09-01 NOTE — Telephone Encounter (Addendum)
Pt needs refill of enalapril-hydrochlorothiazide (VASERETIC) 10-25 MG per tablet Pt aware he needs office visit. Pt made appt for October 04, 2012  Pharm: [facility]

## 2012-09-01 NOTE — Telephone Encounter (Signed)
rx sent in electronically 

## 2012-10-04 ENCOUNTER — Ambulatory Visit (INDEPENDENT_AMBULATORY_CARE_PROVIDER_SITE_OTHER): Payer: No Typology Code available for payment source | Admitting: Family Medicine

## 2012-10-04 ENCOUNTER — Encounter: Payer: Self-pay | Admitting: Family Medicine

## 2012-10-04 VITALS — BP 140/84 | Temp 98.2°F | Wt 135.0 lb

## 2012-10-04 DIAGNOSIS — L989 Disorder of the skin and subcutaneous tissue, unspecified: Secondary | ICD-10-CM

## 2012-10-04 DIAGNOSIS — I10 Essential (primary) hypertension: Secondary | ICD-10-CM

## 2012-10-04 MED ORDER — ENALAPRIL-HYDROCHLOROTHIAZIDE 10-25 MG PO TABS: 1.0000 | ORAL_TABLET | Freq: Every day | ORAL | Status: DC

## 2012-10-04 NOTE — Patient Instructions (Signed)
-  We placed a referral for you as discussed to the dermatologist for the skin issue on your nose. It usually takes about 1-2 weeks to process and schedule this referral. If you have not heard from [place] regarding this appointment in 2 weeks please contact our office.  -schedule a physical exam with your doctor at next availlable
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
## 2013-01-14 ENCOUNTER — Encounter: Payer: No Typology Code available for payment source | Admitting: Internal Medicine

## 2013-01-28 ENCOUNTER — Encounter: Payer: Self-pay | Admitting: Internal Medicine

## 2013-01-28 ENCOUNTER — Ambulatory Visit (INDEPENDENT_AMBULATORY_CARE_PROVIDER_SITE_OTHER): Payer: No Typology Code available for payment source | Admitting: Internal Medicine

## 2013-01-28 VITALS — BP 156/82 | HR 64 | Temp 97.6°F | Ht 70.0 in | Wt 135.0 lb

## 2013-01-28 DIAGNOSIS — Z125 Encounter for screening for malignant neoplasm of prostate: Secondary | ICD-10-CM

## 2013-01-28 DIAGNOSIS — R9389 Abnormal findings on diagnostic imaging of other specified body structures: Secondary | ICD-10-CM

## 2013-01-28 DIAGNOSIS — I10 Essential (primary) hypertension: Secondary | ICD-10-CM

## 2013-01-28 DIAGNOSIS — R93429 Abnormal radiologic findings on diagnostic imaging of unspecified kidney: Secondary | ICD-10-CM

## 2013-01-28 DIAGNOSIS — E785 Hyperlipidemia, unspecified: Secondary | ICD-10-CM | POA: Insufficient documentation

## 2013-01-28 DIAGNOSIS — H8110 Benign paroxysmal vertigo, unspecified ear: Secondary | ICD-10-CM

## 2013-01-28 DIAGNOSIS — Z23 Encounter for immunization: Secondary | ICD-10-CM

## 2013-01-28 DIAGNOSIS — Z0000 Encounter for general adult medical examination without abnormal findings: Secondary | ICD-10-CM

## 2013-01-28 LAB — LIPID PANEL
Cholesterol: 191 mg/dL (ref 0–200)
LDL Cholesterol: 124 mg/dL — ABNORMAL HIGH (ref 0–99)
VLDL: 28.4 mg/dL (ref 0.0–40.0)

## 2013-01-28 LAB — CBC WITH DIFFERENTIAL/PLATELET
Eosinophils Relative: 5.1 % — ABNORMAL HIGH (ref 0.0–5.0)
HCT: 46 % (ref 39.0–52.0)
Hemoglobin: 15.5 g/dL (ref 13.0–17.0)
Lymphs Abs: 2.6 10*3/uL (ref 0.7–4.0)
MCV: 93.3 fl (ref 78.0–100.0)
Monocytes Absolute: 0.7 10*3/uL (ref 0.1–1.0)
Monocytes Relative: 8.6 % (ref 3.0–12.0)
Neutro Abs: 4.7 10*3/uL (ref 1.4–7.7)
Platelets: 195 10*3/uL (ref 150.0–400.0)
RDW: 13.6 % (ref 11.5–14.6)

## 2013-01-28 LAB — POCT URINALYSIS DIPSTICK
Glucose, UA: NEGATIVE
Leukocytes, UA: NEGATIVE
Nitrite, UA: NEGATIVE
Protein, UA: NEGATIVE
Spec Grav, UA: 1.02
Urobilinogen, UA: 0.2

## 2013-01-28 LAB — BASIC METABOLIC PANEL
Chloride: 101 mEq/L (ref 96–112)
GFR: 42.02 mL/min — ABNORMAL LOW (ref >60.00)
Potassium: 4.6 mEq/L (ref 3.5–5.1)
Sodium: 138 mEq/L (ref 135–145)

## 2013-01-28 LAB — HEPATIC FUNCTION PANEL
ALT: 13 U/L (ref 0–53)
AST: 22 U/L (ref 0–37)
Bilirubin, Direct: 0.1 mg/dL (ref 0.0–0.3)
Total Bilirubin: 0.8 mg/dL (ref 0.3–1.2)

## 2013-01-28 LAB — PSA: PSA: 1.89 ng/mL (ref 0.10–4.00)
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
## 2013-01-28 NOTE — Assessment & Plan Note (Signed)
See CT-  Schedule u/s 01/28/2013

## 2013-02-01 ENCOUNTER — Encounter: Payer: No Typology Code available for payment source | Admitting: Internal Medicine

## 2013-02-01 ENCOUNTER — Other Ambulatory Visit: Payer: Self-pay | Admitting: Internal Medicine

## 2013-02-01 DIAGNOSIS — H8190 Unspecified disorder of vestibular function, unspecified ear: Secondary | ICD-10-CM

## 2013-10-04 ENCOUNTER — Other Ambulatory Visit: Payer: Self-pay | Admitting: Family Medicine

## 2013-10-10 ENCOUNTER — Telehealth: Payer: Self-pay | Admitting: Internal Medicine

## 2013-10-10 NOTE — Telephone Encounter (Signed)
Patient Information:  Caller Name: Paarth  Phone: [phone]  Patient: Nathan Gentry, Nathan Gentry  Gender: Male  DOB: [birth year]  Age: 68 Years  PCP: Phoebe Sharps (Adults only, leaving end of July 2015)  Office Follow Up:  Does the office need to follow up with this patient?: Yes  Instructions For The Office: Disposition is 911 for constant chest pain age > 70;  Patient refuses care advice, verbalized understanding and wishes to schedule appt.  Advised note will be sent for office f/u.  RN Note:  Disposition is to call 911;  Patient was advised this is the safest advice we can give over the phone;  We can't r/o cardiac involvement via phone;  He states he is not going to use 911, he doesn't feel it is necessary and would like to make appt. for Wednesday 10/12/13;  Reviewed emergent care with him again, he verbalizes understanding but still wishes to schedule appt. in the office;  Advised note will be sent for office to f/u.  Symptoms  Reason For Call & Symptoms: For the past month he has had a constant irritating pain in the right side of his chest that is progressively p getting worse;  It isn't interfering in his daily activities it is just constant and aggravating.  Since this has been going on he does cough up a lot of white flem;  He is a smoker; Taking a deep breath does not affect it;  It is worse at night.  Pain is not sharp or stabbing, a dull discomfort;  It is not radiating, he denies any sob.  Reviewed Health History In EMR: Yes  Reviewed Medications In EMR: Yes  Reviewed Allergies In EMR: Yes  Reviewed Surgeries / Procedures: Yes  Date of Onset of Symptoms: 09/05/2013  Treatments Tried: Aleve twice a day  Treatments Tried Worked: Yes  Guideline(s) Used:  Chest Pain  Disposition Per Guideline:   Call EMS 911 Now  Reason For Disposition Reached:   Chest pain lasting longer than 5 minutes and ANY of the following:  Over 78 years old Over 37 years old and at least one cardiac risk  factor (i.e., high blood pressure, diabetes, high cholesterol, obesity, smoker or strong family history of heart disease) Pain is crushing, pressure-like, or heavy  Took nitroglycerin and chest pain was not relieved History of heart disease (i.e., angina, heart attack, bypass surgery, angioplasty, CHF)  Advice Given:  N/A  Patient Refused Recommendation:  Patient Refused Care Advice  Patient doesn't feel it is cardiac or 911;  He would like to schedule appt.  Care advice was given, he does verbalize understanding that advice is to call 911.

## 2014-02-24 ENCOUNTER — Other Ambulatory Visit: Payer: Self-pay | Admitting: Internal Medicine

## 2014-03-27 ENCOUNTER — Encounter: Payer: Self-pay | Admitting: Family Medicine

## 2014-03-27 ENCOUNTER — Ambulatory Visit (INDEPENDENT_AMBULATORY_CARE_PROVIDER_SITE_OTHER): Payer: Medicare PPO | Admitting: Family Medicine

## 2014-03-27 ENCOUNTER — Ambulatory Visit (INDEPENDENT_AMBULATORY_CARE_PROVIDER_SITE_OTHER)
Admission: RE | Admit: 2014-03-27 | Discharge: 2014-03-27 | Disposition: A | Discharge status: Home / Self Care | Payer: Medicare PPO | Source: Ambulatory Visit | Attending: Family Medicine | Admitting: Family Medicine

## 2014-03-27 VITALS — BP 161/91 | HR 85 | Temp 99.1°F | Ht 70.0 in | Wt 133.0 lb

## 2014-03-27 DIAGNOSIS — R079 Chest pain, unspecified: Secondary | ICD-10-CM

## 2014-03-27 DIAGNOSIS — R918 Other nonspecific abnormal finding of lung field: Secondary | ICD-10-CM

## 2014-03-27 NOTE — Progress Notes (Signed)
Pre visit review using our clinic review tool, if applicable. No additional management support is needed unless otherwise documented below in the visit note. 

## 2014-03-27 NOTE — Addendum Note (Signed)
Addended by: Alysia Penna A on: 03/27/2014 01:23 PM   Modules accepted: Orders

## 2014-03-27 NOTE — Progress Notes (Signed)
   Subjective:    Patient ID: Nathan Gentry, male    DOB: 11-Mar-1946, 68 y.o.   MRN: [id number]  HPI Here for right sided chest and back pains that started 6 months ago. No hx of recent trauma. The pain is present every day, some days are worse than others. It mostly bothers him at night when he tries to sleep. Advil helps. No coughing or SOB. The pain runs in a band from the center of the back around the right side toward the right breast. It is sharp in nature, it hurts to twist his trunk or take a deep breath.    Review of Systems  Constitutional: Negative.   Respiratory: Negative.   Cardiovascular: Positive for chest pain. Negative for palpitations and leg swelling.       Objective:   Physical Exam  Constitutional: He appears well-developed and well-nourished.  Cardiovascular: Normal rate, regular rhythm, normal heart sounds and intact distal pulses.   EKG normal   Pulmonary/Chest: Effort normal and breath sounds normal. No respiratory distress. He has no wheezes. He has no rales. He exhibits no tenderness.          Assessment & Plan:  Possible pinched thoracic nerve. Use Advil prn. Get Xrays of the chest and spine today

## 2014-03-30 ENCOUNTER — Ambulatory Visit (INDEPENDENT_AMBULATORY_CARE_PROVIDER_SITE_OTHER)
Admission: RE | Admit: 2014-03-30 | Discharge: 2014-03-30 | Disposition: A | Discharge status: Home / Self Care | Payer: Medicare PPO | Source: Ambulatory Visit | Attending: Family Medicine | Admitting: Family Medicine

## 2014-03-30 DIAGNOSIS — R079 Chest pain, unspecified: Secondary | ICD-10-CM

## 2014-04-03 ENCOUNTER — Ambulatory Visit (INDEPENDENT_AMBULATORY_CARE_PROVIDER_SITE_OTHER): Payer: Medicare PPO | Admitting: Pulmonary Disease

## 2014-04-03 ENCOUNTER — Encounter: Payer: Self-pay | Admitting: Pulmonary Disease

## 2014-04-03 VITALS — BP 130/74 | HR 86 | Ht 69.0 in | Wt 136.0 lb

## 2014-04-03 DIAGNOSIS — Z720 Tobacco use: Secondary | ICD-10-CM

## 2014-04-03 DIAGNOSIS — F17200 Nicotine dependence, unspecified, uncomplicated: Secondary | ICD-10-CM

## 2014-04-03 DIAGNOSIS — R918 Other nonspecific abnormal finding of lung field: Secondary | ICD-10-CM | POA: Insufficient documentation
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
## 2014-04-03 NOTE — Assessment & Plan Note (Signed)
He had notable emphysema on his CT scan and I explained to him today that this was directly related to his tobacco abuse. He is at risk for COPD, but he has no respiratory symptoms so we will hold off on pulmonary function testing at this time.  Plan: -Advised at length to quit smoking

## 2014-04-03 NOTE — Assessment & Plan Note (Signed)
I have reviewed the images of the CT chest in clinic with the patient and his wife. I am very concerned about the finding of this pleural-based right upper lobe mass. Given his asbestos exposure in the past I am concerned about the possibility of mesothelioma, however sarcoma and other common causes of lung cancer (adenocarcinoma, squamous cell) are also possible. His persistent pain in this area is also consistent with a slow-growing mass. His smoking history certainly increases his risk for lung cancer.  Today we discussed various options for obtaining a diagnosis. I explained to him that a definitive diagnosis could be obtained via a surgical lung biopsy, but we may be able to get an answer with a needle aspiration with interventional radiology. He prefers to have the least invasive method performed first. Also, he wants to wait until after 04/14/2014 before he has any studies because he has a very busy time with family coming up. We discussed the risks of a CT guided biopsy including bleeding, hemothorax, and pneumothorax.  Plan: -CT-guided biopsy of the right pleural-based lung mass in January 2016 -Follow-up with me after that

## 2014-04-03 NOTE — Patient Instructions (Signed)
Quit smoking! We will arrange a CT guided lung biopsy and call you with the results We will see you back in 3 weeks or sooner if needed

## 2014-04-03 NOTE — Addendum Note (Signed)
Addended by: Alysia Penna A on: 04/03/2014 09:41 AM   Modules accepted: Orders

## 2014-04-16 ENCOUNTER — Other Ambulatory Visit: Payer: Self-pay | Admitting: Radiology

## 2014-04-18 ENCOUNTER — Ambulatory Visit (HOSPITAL_COMMUNITY)
Admission: RE | Admit: 2014-04-18 | Discharge: 2014-04-18 | Disposition: A | Discharge status: Home / Self Care | Payer: Medicare PPO | Source: Ambulatory Visit | Attending: Pulmonary Disease | Admitting: Pulmonary Disease

## 2014-04-18 ENCOUNTER — Encounter (HOSPITAL_COMMUNITY): Payer: Self-pay

## 2014-04-18 DIAGNOSIS — R918 Other nonspecific abnormal finding of lung field: Secondary | ICD-10-CM | POA: Diagnosis present

## 2014-04-18 DIAGNOSIS — Z9889 Other specified postprocedural states: Secondary | ICD-10-CM

## 2014-04-18 LAB — PROTIME-INR
INR: 0.99 (ref 0.00–1.49)
PROTHROMBIN TIME: 13.2 s (ref 11.6–15.2)

## 2014-04-18 LAB — CBC
HEMATOCRIT: 43.7 % (ref 39.0–52.0)
Hemoglobin: 14.6 g/dL (ref 13.0–17.0)
MCH: 30.5 pg (ref 26.0–34.0)
MCHC: 33.4 g/dL (ref 30.0–36.0)
MCV: 91.2 fL (ref 78.0–100.0)
PLATELETS: 153 10*3/uL (ref 150–400)
RBC: 4.79 MIL/uL (ref 4.22–5.81)
RDW: 13.7 % (ref 11.5–15.5)
WBC: 7.8 10*3/uL (ref 4.0–10.5)

## 2014-04-18 LAB — APTT: aPTT: 36 seconds (ref 24–37)

## 2014-04-18 MED ORDER — FENTANYL CITRATE 0.05 MG/ML IJ SOLN
INTRAMUSCULAR | Status: AC | PRN
  Administered 2014-04-18: 50 ug via INTRAVENOUS

## 2014-04-18 MED ORDER — SODIUM CHLORIDE 0.9 % IV SOLN
INTRAVENOUS | Status: AC | PRN
  Administered 2014-04-18: 10 mL/h via INTRAVENOUS

## 2014-04-18 MED ORDER — MIDAZOLAM HCL 2 MG/2ML IJ SOLN
INTRAMUSCULAR | Status: AC | PRN
  Administered 2014-04-18: 1 mg via INTRAVENOUS

## 2014-04-18 MED ORDER — MIDAZOLAM HCL 2 MG/2ML IJ SOLN
INTRAMUSCULAR | Status: AC
  Filled 2014-04-18: qty 4

## 2014-04-18 MED ORDER — LIDOCAINE HCL 1 % IJ SOLN
INTRAMUSCULAR | Status: AC
  Filled 2014-04-18: qty 20

## 2014-04-18 MED ORDER — SODIUM CHLORIDE 0.9 % IV SOLN
INTRAVENOUS | Status: DC
  Administered 2014-04-18: 08:00:00 via INTRAVENOUS

## 2014-04-18 MED ORDER — FENTANYL CITRATE 0.05 MG/ML IJ SOLN
INTRAMUSCULAR | Status: AC
  Filled 2014-04-18: qty 4

## 2014-04-18 NOTE — Discharge Instructions (Signed)
Needle Biopsy of Lung, Care After °Refer to this sheet in the next few weeks. These instructions provide you with information on caring for yourself after your procedure. Your health care provider may also give you more specific instructions. Your treatment has been planned according to current medical practices, but problems sometimes occur. Call your health care provider if you have any problems or questions after your procedure. °WHAT TO EXPECT AFTER THE PROCEDURE °· A bandage will be applied over the area where the needle was inserted. You may be asked to apply pressure to the bandage for several minutes to ensure there is minimal bleeding. °· In most cases, you can leave when your needle biopsy procedure is completed. Do not drive yourself home. Someone else should take you home. °· If you received an IV sedative or general anesthetic, you will be taken to a comfortable place to relax while the medicine wears off. °· If you have upcoming travel scheduled, talk to your health care provider about when it is safe to travel by air after the procedure. °HOME CARE INSTRUCTIONS °· Expect to take it easy for the rest of the day. °· Protect the area where you received the needle biopsy by keeping the bandage in place for as long as instructed. °· You may feel some mild pain or discomfort in the area, but this should stop in a day or two. °· Take medicines only as directed by your health care provider. °SEEK MEDICAL CARE IF:  °· You have pain at the biopsy site that worsens or is not helped by medicine. °· You have swelling or drainage at the needle biopsy site. °· You have a fever. °SEEK IMMEDIATE MEDICAL CARE IF:  °· You have new or worsening shortness of breath. °· You have chest pain. °· You are coughing up blood. °· You have bleeding that does not stop with pressure or a bandage. °· You develop light-headedness or fainting. °Document Released: 01/26/2007 Document Revised: 08/15/2013 Document Reviewed:  08/23/2012 °[employer]® Patient Information ©2015 [employer]. This information is not intended to replace advice given to you by your health care provider. Make sure you discuss any questions you have with your health care provider. ° °
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
## 2014-04-18 NOTE — Progress Notes (Signed)
Pt to radiology for CXR.

## 2014-04-18 NOTE — Sedation Documentation (Signed)
02 d/c 

## 2014-04-18 NOTE — Procedures (Signed)
RUL lung Bx 18 g core times 4 No comp
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
## 2014-04-19 ENCOUNTER — Telehealth: Payer: Self-pay | Admitting: Pulmonary Disease

## 2014-04-19 ENCOUNTER — Other Ambulatory Visit: Payer: Self-pay | Admitting: Pulmonary Disease

## 2014-04-19 DIAGNOSIS — C3411 Malignant neoplasm of upper lobe, right bronchus or lung: Secondary | ICD-10-CM

## 2014-04-19 NOTE — Telephone Encounter (Signed)
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  He voiced understanding.  We will refer him to an oncologist near his home.

## 2014-04-21 ENCOUNTER — Telehealth: Payer: Self-pay | Admitting: Pulmonary Disease

## 2014-04-21 NOTE — Telephone Encounter (Signed)
Pt called wanting to know the status of the referral to oncology near his home; pt is aware that order was sent to oncology on 04-20-14 afternoon and he should hear from the office there to schedule a consult with oncology. Pt is aware if he has not heard from them by this time on Monday 04-24-14 to please contact our office so we may find out what the hold up is. Nothing more needed at this time. Thanks.

## 2014-05-23 ENCOUNTER — Other Ambulatory Visit: Payer: Self-pay | Admitting: Internal Medicine

## 2014-05-31 LAB — PULMONARY FUNCTION TEST

## 2014-08-15 ENCOUNTER — Ambulatory Visit: Payer: Medicare PPO | Admitting: Family Medicine

## 2014-08-15 DIAGNOSIS — Z0289 Encounter for other administrative examinations: Secondary | ICD-10-CM

## 2016-09-10 IMAGING — CT CT CHEST W/O CM
2 of 4 series · 15 of 36 positions shown, 18 images · non-contrast
Comparison: Chest x-ray dated 03/27/2014 and chest CT dated
08/15/2010

CLINICAL DATA: Right-sided chest pain that radiates to the thoracic
spine for 6 months. New mass in the right upper lobe.

EXAM:
CT CHEST WITHOUT CONTRAST
TECHNIQUE: Multidetector CT imaging of the chest was performed following the
standard protocol without IV contrast..

[Series 2: chest routine with · axial · 0.71mm/px · z∈[-296,-10]mm · 12 of 67 slices shown, 15 images]
[im 5/67  mediastinal]
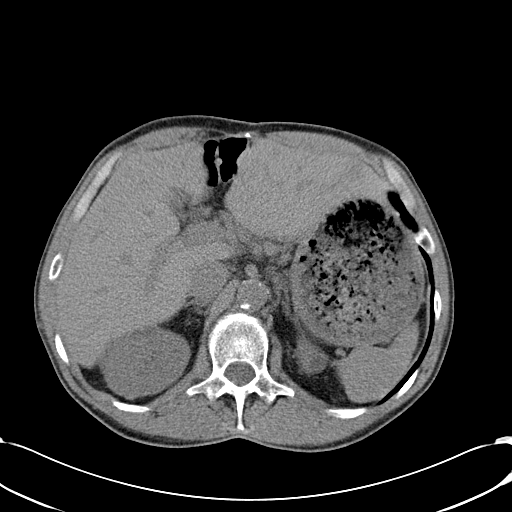
[im 5/67  lung]
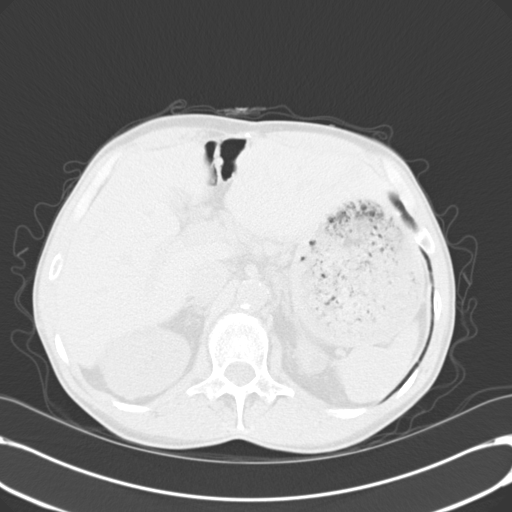
[im 10/67  lung]
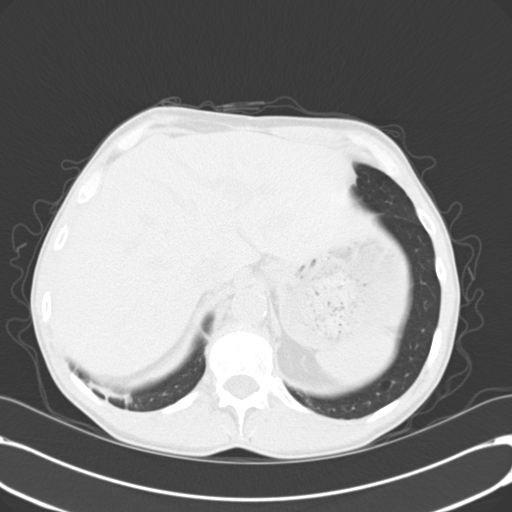
[im 15/67  lung]
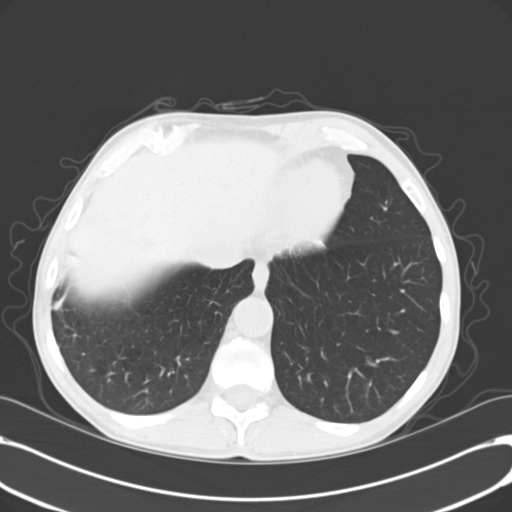
[im 19/67  lung]
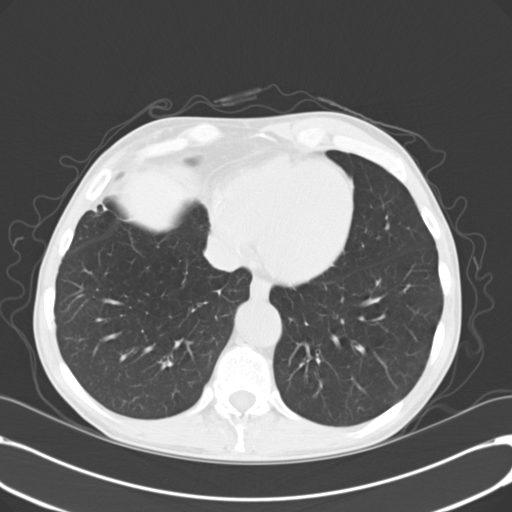
[im 24/67  mediastinal]
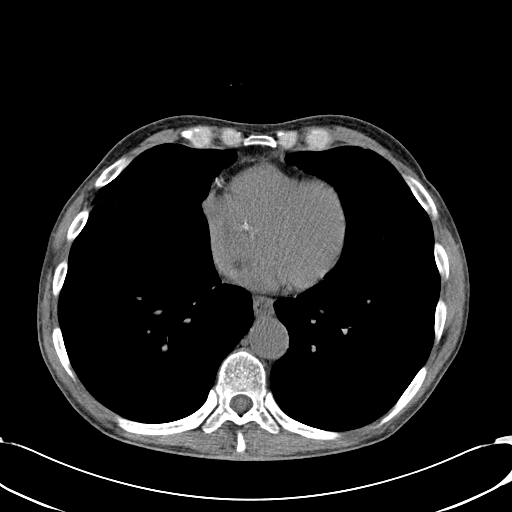
[im 24/67  lung]
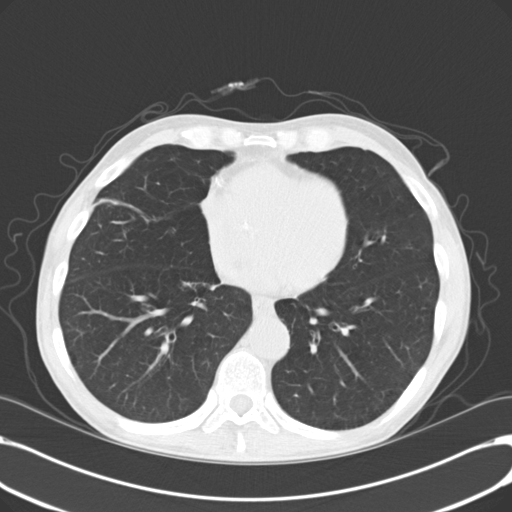
[im 29/67  lung]
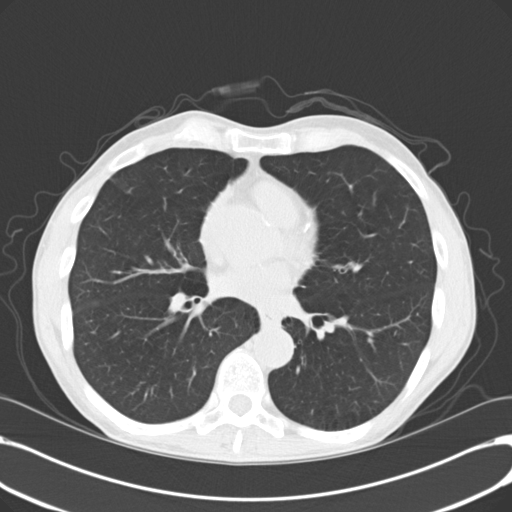
[im 38/67  lung]
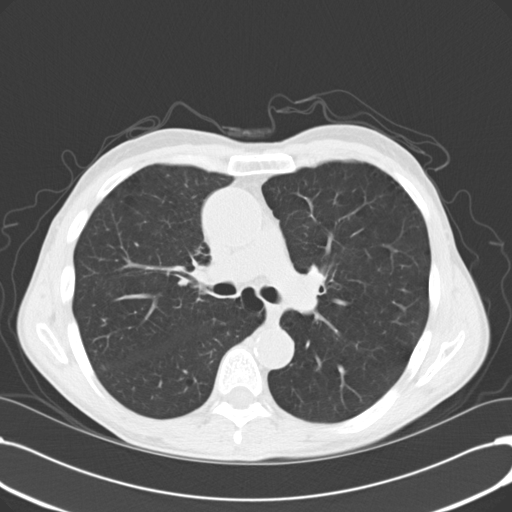
[im 43/67  lung]
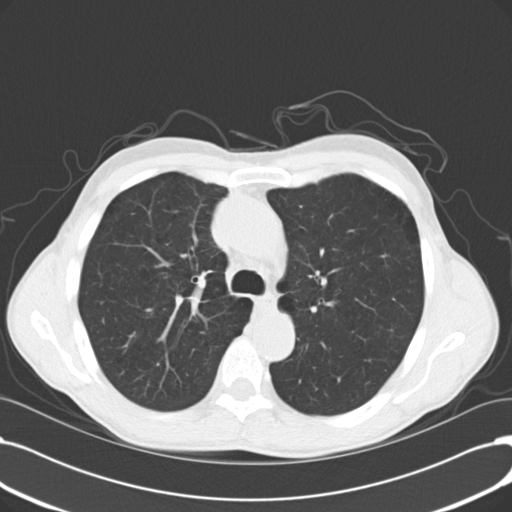
[im 48/67  mediastinal]
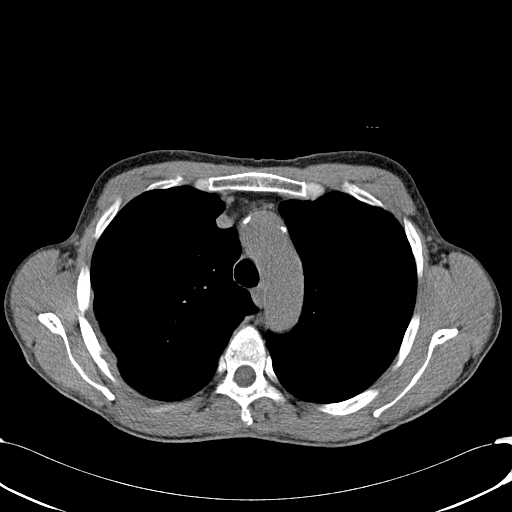
[im 48/67  lung]
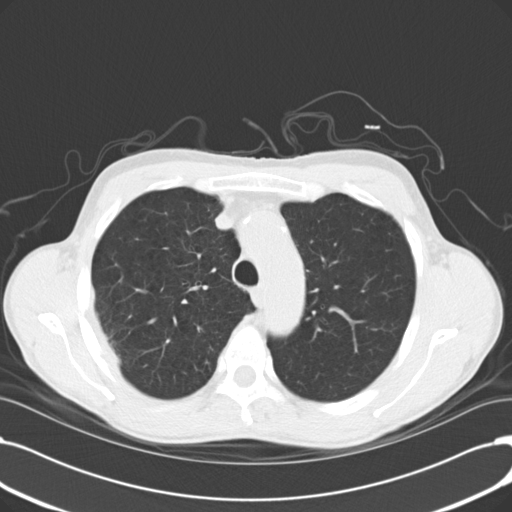
[im 52/67  lung]
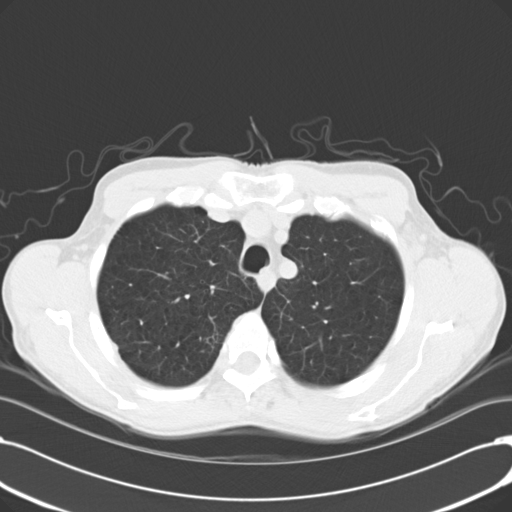
[im 57/67  lung]
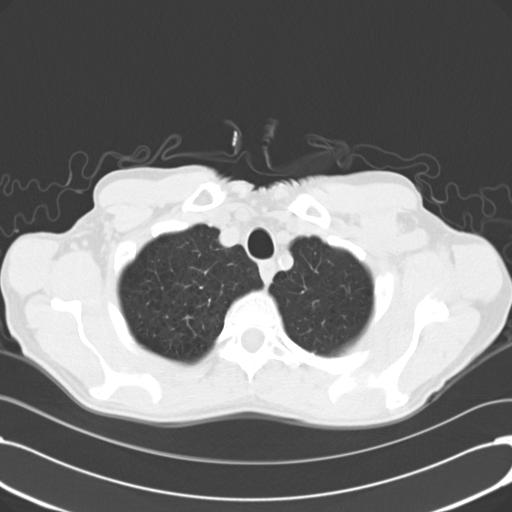
[im 62/67  lung]
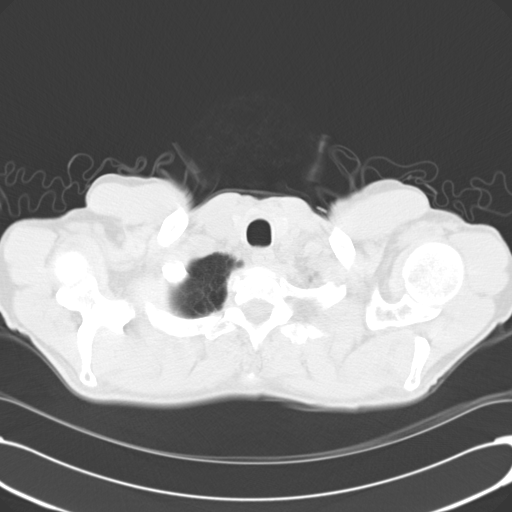

[Series 602: cor · coronal · 0.71mm/px · 3 of 114 slices shown]
[im 23/114  lung]
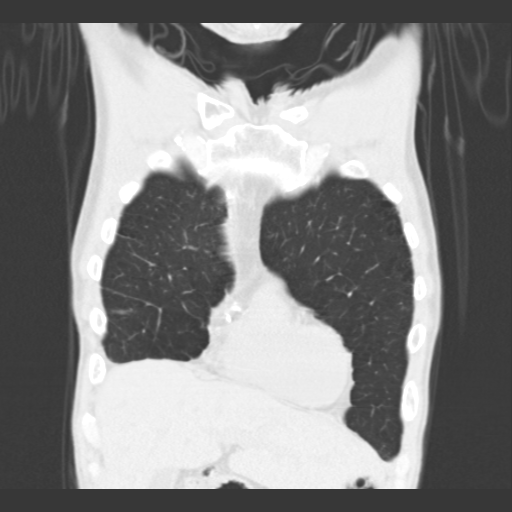
[im 46/114  lung]
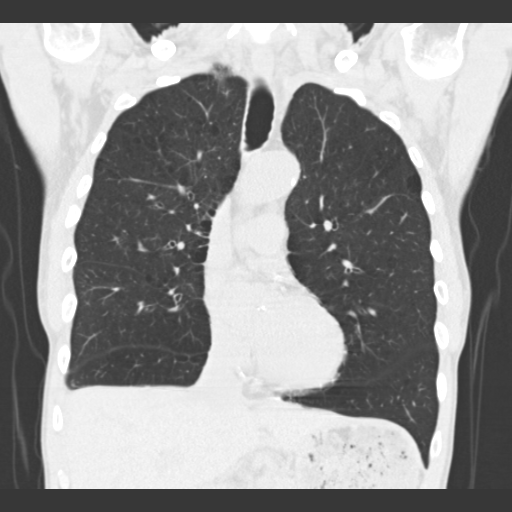
[im 68/114  lung]
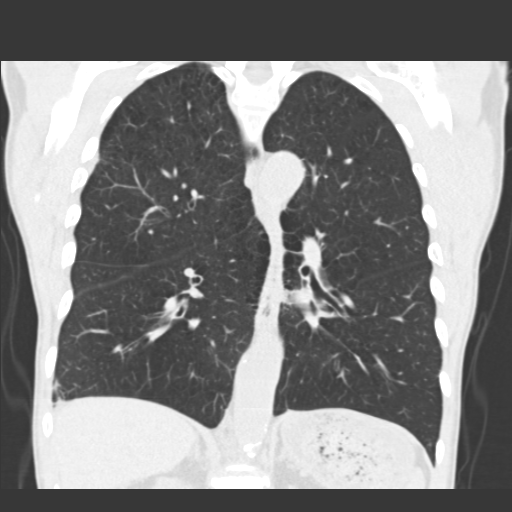

[15 of 36 positions shown; findings below may reference images not displayed]

FINDINGS: There is an irregular pleural-based tumor measuring 6.7 x 5.0 x
cm with partial destruction of the inner aspect of the right fifth
rib.

There is minimal chronic linear scarring at the right lung base.
Emphysematous changes are present diffusely but primarily in the
upper lobes. There is no appreciable hilar or mediastinal adenopathy
on this noncontrast study. Moderate coronary artery calcification.
Heart size is normal. No effusions.

Views of the upper abdomen demonstrate left renal atrophy. Zero 3
lesions are visible in the upper pole of the left kidney, 14 and 14
mm but appear to be cysts as 17 mm slightly higher density lesion
laterally which is probably a proteinaceous cyst, unchanged since
08/15/2010.

There is a hyperdense 10 mm nodule in the lateral aspect of the
upper pole of the right kidney most likely a hyperdense cyst but
indeterminate.

There is a vague 14 mm area of lucency in the lateral aspect of the
left lobe of the liver consistent with a benign hemangioma
demonstrated on the prior study of 03/22/2004.
IMPRESSION: Irregular pleural-based tumor in the lateral aspect of the right
upper lobe with partial destruction of the right fifth rib. This
probably represents a primary lung carcinoma.

## 2018-05-15 DEATH — deceased
